# Patient Record
Sex: Female | Born: 1985 | Race: White | Hispanic: No | Marital: Single
Health system: Southern US, Community
[De-identification: ages and names within clinical notes are randomized; demographics above are authoritative.]

---

## 2014-09-28 ENCOUNTER — Ambulatory Visit: Payer: Self-pay | Admitting: Obstetrics and Gynecology

## 2016-03-13 DIAGNOSIS — N979 Female infertility, unspecified: Secondary | ICD-10-CM | POA: Diagnosis not present

## 2016-03-18 DIAGNOSIS — N979 Female infertility, unspecified: Secondary | ICD-10-CM | POA: Diagnosis not present

## 2016-03-20 DIAGNOSIS — N979 Female infertility, unspecified: Secondary | ICD-10-CM | POA: Diagnosis not present

## 2016-03-22 DIAGNOSIS — Z3189 Encounter for other procreative management: Secondary | ICD-10-CM | POA: Diagnosis not present

## 2016-04-06 DIAGNOSIS — Z3200 Encounter for pregnancy test, result unknown: Secondary | ICD-10-CM | POA: Diagnosis not present

## 2016-04-09 DIAGNOSIS — Z3201 Encounter for pregnancy test, result positive: Secondary | ICD-10-CM | POA: Diagnosis not present

## 2016-04-26 DIAGNOSIS — O200 Threatened abortion: Secondary | ICD-10-CM | POA: Diagnosis not present

## 2016-05-07 DIAGNOSIS — O200 Threatened abortion: Secondary | ICD-10-CM | POA: Diagnosis not present

## 2016-05-16 DIAGNOSIS — Z36 Encounter for antenatal screening of mother: Secondary | ICD-10-CM | POA: Diagnosis not present

## 2016-05-16 DIAGNOSIS — Z3480 Encounter for supervision of other normal pregnancy, unspecified trimester: Secondary | ICD-10-CM | POA: Diagnosis not present

## 2016-05-22 DIAGNOSIS — Z36 Encounter for antenatal screening of mother: Secondary | ICD-10-CM | POA: Diagnosis not present

## 2016-06-08 DIAGNOSIS — Z315 Encounter for genetic counseling: Secondary | ICD-10-CM | POA: Diagnosis not present

## 2016-06-08 DIAGNOSIS — Z36 Encounter for antenatal screening of mother: Secondary | ICD-10-CM | POA: Diagnosis not present

## 2016-06-20 DIAGNOSIS — Z23 Encounter for immunization: Secondary | ICD-10-CM | POA: Diagnosis not present

## 2016-07-02 DIAGNOSIS — F988 Other specified behavioral and emotional disorders with onset usually occurring in childhood and adolescence: Secondary | ICD-10-CM | POA: Diagnosis not present

## 2016-07-02 DIAGNOSIS — Z6831 Body mass index (BMI) 31.0-31.9, adult: Secondary | ICD-10-CM | POA: Diagnosis not present

## 2016-07-02 DIAGNOSIS — Z0000 Encounter for general adult medical examination without abnormal findings: Secondary | ICD-10-CM | POA: Diagnosis not present

## 2016-07-13 DIAGNOSIS — Z3A26 26 weeks gestation of pregnancy: Secondary | ICD-10-CM | POA: Diagnosis not present

## 2016-07-13 DIAGNOSIS — Z3A28 28 weeks gestation of pregnancy: Secondary | ICD-10-CM | POA: Diagnosis not present

## 2016-07-13 DIAGNOSIS — O30042 Twin pregnancy, dichorionic/diamniotic, second trimester: Secondary | ICD-10-CM | POA: Diagnosis not present

## 2016-07-13 DIAGNOSIS — Z3402 Encounter for supervision of normal first pregnancy, second trimester: Secondary | ICD-10-CM | POA: Diagnosis not present

## 2016-07-13 DIAGNOSIS — Z3482 Encounter for supervision of other normal pregnancy, second trimester: Secondary | ICD-10-CM | POA: Diagnosis not present

## 2016-09-10 DIAGNOSIS — Z3480 Encounter for supervision of other normal pregnancy, unspecified trimester: Secondary | ICD-10-CM | POA: Diagnosis not present

## 2016-09-10 DIAGNOSIS — O30042 Twin pregnancy, dichorionic/diamniotic, second trimester: Secondary | ICD-10-CM | POA: Diagnosis not present

## 2016-09-10 DIAGNOSIS — Z3A26 26 weeks gestation of pregnancy: Secondary | ICD-10-CM | POA: Diagnosis not present

## 2016-09-10 DIAGNOSIS — Z3482 Encounter for supervision of other normal pregnancy, second trimester: Secondary | ICD-10-CM | POA: Diagnosis not present

## 2016-09-10 DIAGNOSIS — Z3483 Encounter for supervision of other normal pregnancy, third trimester: Secondary | ICD-10-CM | POA: Diagnosis not present

## 2016-09-14 DIAGNOSIS — O99282 Endocrine, nutritional and metabolic diseases complicating pregnancy, second trimester: Secondary | ICD-10-CM | POA: Diagnosis not present

## 2016-09-21 DIAGNOSIS — Z6835 Body mass index (BMI) 35.0-35.9, adult: Secondary | ICD-10-CM | POA: Diagnosis not present

## 2016-09-21 DIAGNOSIS — O24419 Gestational diabetes mellitus in pregnancy, unspecified control: Secondary | ICD-10-CM | POA: Diagnosis not present

## 2016-10-10 DIAGNOSIS — O24410 Gestational diabetes mellitus in pregnancy, diet controlled: Secondary | ICD-10-CM | POA: Diagnosis not present

## 2016-10-10 DIAGNOSIS — Z3A30 30 weeks gestation of pregnancy: Secondary | ICD-10-CM | POA: Diagnosis not present

## 2016-10-10 DIAGNOSIS — Z23 Encounter for immunization: Secondary | ICD-10-CM | POA: Diagnosis not present

## 2016-10-10 DIAGNOSIS — O30043 Twin pregnancy, dichorionic/diamniotic, third trimester: Secondary | ICD-10-CM | POA: Diagnosis not present

## 2016-10-17 DIAGNOSIS — Z3483 Encounter for supervision of other normal pregnancy, third trimester: Secondary | ICD-10-CM | POA: Diagnosis not present

## 2016-10-17 DIAGNOSIS — Z3482 Encounter for supervision of other normal pregnancy, second trimester: Secondary | ICD-10-CM | POA: Diagnosis not present

## 2016-10-29 DIAGNOSIS — O30043 Twin pregnancy, dichorionic/diamniotic, third trimester: Secondary | ICD-10-CM | POA: Diagnosis not present

## 2016-11-06 DIAGNOSIS — Z3A34 34 weeks gestation of pregnancy: Secondary | ICD-10-CM | POA: Diagnosis not present

## 2016-11-06 DIAGNOSIS — Z3403 Encounter for supervision of normal first pregnancy, third trimester: Secondary | ICD-10-CM | POA: Diagnosis not present

## 2016-11-06 DIAGNOSIS — O30042 Twin pregnancy, dichorionic/diamniotic, second trimester: Secondary | ICD-10-CM | POA: Diagnosis not present

## 2016-11-06 DIAGNOSIS — O30043 Twin pregnancy, dichorionic/diamniotic, third trimester: Secondary | ICD-10-CM | POA: Diagnosis not present

## 2016-11-13 DIAGNOSIS — Z3483 Encounter for supervision of other normal pregnancy, third trimester: Secondary | ICD-10-CM | POA: Diagnosis not present

## 2016-11-13 DIAGNOSIS — Z3480 Encounter for supervision of other normal pregnancy, unspecified trimester: Secondary | ICD-10-CM | POA: Diagnosis not present

## 2016-11-15 DIAGNOSIS — O30003 Twin pregnancy, unspecified number of placenta and unspecified number of amniotic sacs, third trimester: Secondary | ICD-10-CM | POA: Diagnosis not present

## 2016-11-15 DIAGNOSIS — M79662 Pain in left lower leg: Secondary | ICD-10-CM | POA: Diagnosis not present

## 2016-11-15 DIAGNOSIS — O9989 Other specified diseases and conditions complicating pregnancy, childbirth and the puerperium: Secondary | ICD-10-CM | POA: Diagnosis not present

## 2016-11-15 DIAGNOSIS — M79669 Pain in unspecified lower leg: Secondary | ICD-10-CM | POA: Diagnosis not present

## 2016-11-15 DIAGNOSIS — Z3A36 36 weeks gestation of pregnancy: Secondary | ICD-10-CM | POA: Diagnosis not present

## 2016-11-15 DIAGNOSIS — R590 Localized enlarged lymph nodes: Secondary | ICD-10-CM | POA: Diagnosis not present

## 2016-11-15 DIAGNOSIS — M79661 Pain in right lower leg: Secondary | ICD-10-CM | POA: Diagnosis not present

## 2016-11-20 DIAGNOSIS — O30043 Twin pregnancy, dichorionic/diamniotic, third trimester: Secondary | ICD-10-CM | POA: Diagnosis not present

## 2016-11-20 DIAGNOSIS — O321XX2 Maternal care for breech presentation, fetus 2: Secondary | ICD-10-CM | POA: Diagnosis not present

## 2016-11-20 DIAGNOSIS — O2686 Pruritic urticarial papules and plaques of pregnancy (PUPPP): Secondary | ICD-10-CM | POA: Diagnosis not present

## 2016-11-20 DIAGNOSIS — Z3A36 36 weeks gestation of pregnancy: Secondary | ICD-10-CM | POA: Diagnosis not present

## 2016-11-20 DIAGNOSIS — R590 Localized enlarged lymph nodes: Secondary | ICD-10-CM | POA: Diagnosis not present

## 2016-11-26 DIAGNOSIS — Z3493 Encounter for supervision of normal pregnancy, unspecified, third trimester: Secondary | ICD-10-CM | POA: Diagnosis not present

## 2016-11-26 DIAGNOSIS — O30043 Twin pregnancy, dichorionic/diamniotic, third trimester: Secondary | ICD-10-CM | POA: Diagnosis not present

## 2016-11-29 DIAGNOSIS — H1032 Unspecified acute conjunctivitis, left eye: Secondary | ICD-10-CM | POA: Diagnosis not present

## 2016-11-30 DIAGNOSIS — Z3A38 38 weeks gestation of pregnancy: Secondary | ICD-10-CM | POA: Diagnosis not present

## 2016-12-03 DIAGNOSIS — O9081 Anemia of the puerperium: Secondary | ICD-10-CM | POA: Diagnosis not present

## 2016-12-03 DIAGNOSIS — O321XX9 Maternal care for breech presentation, other fetus: Secondary | ICD-10-CM | POA: Diagnosis not present

## 2016-12-03 DIAGNOSIS — O321XX2 Maternal care for breech presentation, fetus 2: Secondary | ICD-10-CM | POA: Diagnosis not present

## 2016-12-03 DIAGNOSIS — G8918 Other acute postprocedural pain: Secondary | ICD-10-CM | POA: Diagnosis not present

## 2016-12-03 DIAGNOSIS — K219 Gastro-esophageal reflux disease without esophagitis: Secondary | ICD-10-CM | POA: Diagnosis not present

## 2016-12-03 DIAGNOSIS — R1084 Generalized abdominal pain: Secondary | ICD-10-CM | POA: Diagnosis not present

## 2016-12-03 DIAGNOSIS — Z3A38 38 weeks gestation of pregnancy: Secondary | ICD-10-CM | POA: Diagnosis not present

## 2016-12-03 DIAGNOSIS — D649 Anemia, unspecified: Secondary | ICD-10-CM | POA: Diagnosis not present

## 2016-12-03 DIAGNOSIS — O2412 Pre-existing diabetes mellitus, type 2, in childbirth: Secondary | ICD-10-CM | POA: Diagnosis not present

## 2016-12-03 DIAGNOSIS — O30003 Twin pregnancy, unspecified number of placenta and unspecified number of amniotic sacs, third trimester: Secondary | ICD-10-CM | POA: Diagnosis not present

## 2016-12-03 DIAGNOSIS — O9962 Diseases of the digestive system complicating childbirth: Secondary | ICD-10-CM | POA: Diagnosis not present

## 2016-12-03 DIAGNOSIS — R600 Localized edema: Secondary | ICD-10-CM | POA: Diagnosis not present

## 2016-12-04 DIAGNOSIS — G8918 Other acute postprocedural pain: Secondary | ICD-10-CM | POA: Diagnosis not present

## 2016-12-04 DIAGNOSIS — R600 Localized edema: Secondary | ICD-10-CM | POA: Diagnosis not present

## 2016-12-04 DIAGNOSIS — O30003 Twin pregnancy, unspecified number of placenta and unspecified number of amniotic sacs, third trimester: Secondary | ICD-10-CM | POA: Diagnosis not present

## 2016-12-04 DIAGNOSIS — R1084 Generalized abdominal pain: Secondary | ICD-10-CM | POA: Diagnosis not present

## 2017-01-28 DIAGNOSIS — R748 Abnormal levels of other serum enzymes: Secondary | ICD-10-CM | POA: Diagnosis not present

## 2017-01-28 DIAGNOSIS — F988 Other specified behavioral and emotional disorders with onset usually occurring in childhood and adolescence: Secondary | ICD-10-CM | POA: Diagnosis not present

## 2017-01-28 DIAGNOSIS — O24419 Gestational diabetes mellitus in pregnancy, unspecified control: Secondary | ICD-10-CM | POA: Diagnosis not present

## 2017-01-28 DIAGNOSIS — E663 Overweight: Secondary | ICD-10-CM | POA: Diagnosis not present

## 2017-01-28 DIAGNOSIS — D649 Anemia, unspecified: Secondary | ICD-10-CM | POA: Diagnosis not present

## 2017-01-28 DIAGNOSIS — Z79899 Other long term (current) drug therapy: Secondary | ICD-10-CM | POA: Diagnosis not present

## 2017-08-13 DIAGNOSIS — H68001 Unspecified Eustachian salpingitis, right ear: Secondary | ICD-10-CM | POA: Diagnosis not present

## 2017-08-28 DIAGNOSIS — Z0000 Encounter for general adult medical examination without abnormal findings: Secondary | ICD-10-CM | POA: Diagnosis not present

## 2017-08-30 DIAGNOSIS — Z23 Encounter for immunization: Secondary | ICD-10-CM | POA: Diagnosis not present

## 2017-08-30 DIAGNOSIS — Z1331 Encounter for screening for depression: Secondary | ICD-10-CM | POA: Diagnosis not present

## 2017-08-30 DIAGNOSIS — F988 Other specified behavioral and emotional disorders with onset usually occurring in childhood and adolescence: Secondary | ICD-10-CM | POA: Diagnosis not present

## 2017-08-30 DIAGNOSIS — Z0000 Encounter for general adult medical examination without abnormal findings: Secondary | ICD-10-CM | POA: Diagnosis not present

## 2017-08-30 DIAGNOSIS — Z79899 Other long term (current) drug therapy: Secondary | ICD-10-CM | POA: Diagnosis not present

## 2017-11-06 DIAGNOSIS — Z6829 Body mass index (BMI) 29.0-29.9, adult: Secondary | ICD-10-CM | POA: Diagnosis not present

## 2017-11-06 DIAGNOSIS — N390 Urinary tract infection, site not specified: Secondary | ICD-10-CM | POA: Diagnosis not present

## 2017-11-27 DIAGNOSIS — J111 Influenza due to unidentified influenza virus with other respiratory manifestations: Secondary | ICD-10-CM | POA: Diagnosis not present

## 2017-11-29 DIAGNOSIS — Z6829 Body mass index (BMI) 29.0-29.9, adult: Secondary | ICD-10-CM | POA: Diagnosis not present

## 2017-11-29 DIAGNOSIS — J069 Acute upper respiratory infection, unspecified: Secondary | ICD-10-CM | POA: Diagnosis not present

## 2017-11-29 DIAGNOSIS — B9789 Other viral agents as the cause of diseases classified elsewhere: Secondary | ICD-10-CM | POA: Diagnosis not present

## 2017-12-04 DIAGNOSIS — J209 Acute bronchitis, unspecified: Secondary | ICD-10-CM | POA: Diagnosis not present

## 2017-12-04 DIAGNOSIS — Z6829 Body mass index (BMI) 29.0-29.9, adult: Secondary | ICD-10-CM | POA: Diagnosis not present

## 2018-04-10 DIAGNOSIS — Z8632 Personal history of gestational diabetes: Secondary | ICD-10-CM | POA: Diagnosis not present

## 2018-04-10 DIAGNOSIS — Z6830 Body mass index (BMI) 30.0-30.9, adult: Secondary | ICD-10-CM | POA: Diagnosis not present

## 2018-04-10 DIAGNOSIS — Z01419 Encounter for gynecological examination (general) (routine) without abnormal findings: Secondary | ICD-10-CM | POA: Diagnosis not present

## 2018-04-10 DIAGNOSIS — N643 Galactorrhea not associated with childbirth: Secondary | ICD-10-CM | POA: Diagnosis not present

## 2018-04-21 DIAGNOSIS — Z79899 Other long term (current) drug therapy: Secondary | ICD-10-CM | POA: Diagnosis not present

## 2018-04-21 DIAGNOSIS — Z8632 Personal history of gestational diabetes: Secondary | ICD-10-CM | POA: Diagnosis not present

## 2018-04-21 DIAGNOSIS — Z131 Encounter for screening for diabetes mellitus: Secondary | ICD-10-CM | POA: Diagnosis not present

## 2018-04-21 DIAGNOSIS — F988 Other specified behavioral and emotional disorders with onset usually occurring in childhood and adolescence: Secondary | ICD-10-CM | POA: Diagnosis not present

## 2018-11-28 DIAGNOSIS — Z79899 Other long term (current) drug therapy: Secondary | ICD-10-CM | POA: Diagnosis not present

## 2018-11-28 DIAGNOSIS — F988 Other specified behavioral and emotional disorders with onset usually occurring in childhood and adolescence: Secondary | ICD-10-CM | POA: Diagnosis not present

## 2018-11-28 DIAGNOSIS — Z6829 Body mass index (BMI) 29.0-29.9, adult: Secondary | ICD-10-CM | POA: Diagnosis not present

## 2018-12-25 DIAGNOSIS — Z87440 Personal history of urinary (tract) infections: Secondary | ICD-10-CM | POA: Diagnosis not present

## 2019-02-16 DIAGNOSIS — E289 Ovarian dysfunction, unspecified: Secondary | ICD-10-CM | POA: Diagnosis not present

## 2019-05-25 DIAGNOSIS — Z79899 Other long term (current) drug therapy: Secondary | ICD-10-CM | POA: Diagnosis not present

## 2019-05-25 DIAGNOSIS — Z1331 Encounter for screening for depression: Secondary | ICD-10-CM | POA: Diagnosis not present

## 2019-05-25 DIAGNOSIS — Z6828 Body mass index (BMI) 28.0-28.9, adult: Secondary | ICD-10-CM | POA: Diagnosis not present

## 2019-05-25 DIAGNOSIS — F988 Other specified behavioral and emotional disorders with onset usually occurring in childhood and adolescence: Secondary | ICD-10-CM | POA: Diagnosis not present

## 2019-06-09 DIAGNOSIS — L821 Other seborrheic keratosis: Secondary | ICD-10-CM | POA: Diagnosis not present

## 2019-06-09 DIAGNOSIS — D1722 Benign lipomatous neoplasm of skin and subcutaneous tissue of left arm: Secondary | ICD-10-CM | POA: Diagnosis not present

## 2019-06-09 DIAGNOSIS — D225 Melanocytic nevi of trunk: Secondary | ICD-10-CM | POA: Diagnosis not present

## 2019-06-09 DIAGNOSIS — I789 Disease of capillaries, unspecified: Secondary | ICD-10-CM | POA: Diagnosis not present

## 2019-06-11 DIAGNOSIS — Z6831 Body mass index (BMI) 31.0-31.9, adult: Secondary | ICD-10-CM | POA: Diagnosis not present

## 2019-06-11 DIAGNOSIS — Z3009 Encounter for other general counseling and advice on contraception: Secondary | ICD-10-CM | POA: Diagnosis not present

## 2019-06-11 DIAGNOSIS — Z01419 Encounter for gynecological examination (general) (routine) without abnormal findings: Secondary | ICD-10-CM | POA: Diagnosis not present

## 2019-11-24 DIAGNOSIS — Z79899 Other long term (current) drug therapy: Secondary | ICD-10-CM | POA: Diagnosis not present

## 2019-11-24 DIAGNOSIS — Z6828 Body mass index (BMI) 28.0-28.9, adult: Secondary | ICD-10-CM | POA: Diagnosis not present

## 2019-11-24 DIAGNOSIS — F988 Other specified behavioral and emotional disorders with onset usually occurring in childhood and adolescence: Secondary | ICD-10-CM | POA: Diagnosis not present

## 2020-05-24 DIAGNOSIS — N390 Urinary tract infection, site not specified: Secondary | ICD-10-CM | POA: Diagnosis not present

## 2020-05-24 DIAGNOSIS — Z79899 Other long term (current) drug therapy: Secondary | ICD-10-CM | POA: Diagnosis not present

## 2020-05-24 DIAGNOSIS — Z6829 Body mass index (BMI) 29.0-29.9, adult: Secondary | ICD-10-CM | POA: Diagnosis not present

## 2020-05-24 DIAGNOSIS — F988 Other specified behavioral and emotional disorders with onset usually occurring in childhood and adolescence: Secondary | ICD-10-CM | POA: Diagnosis not present

## 2020-06-21 DIAGNOSIS — N943 Premenstrual tension syndrome: Secondary | ICD-10-CM | POA: Diagnosis not present

## 2020-06-21 DIAGNOSIS — Z01419 Encounter for gynecological examination (general) (routine) without abnormal findings: Secondary | ICD-10-CM | POA: Diagnosis not present

## 2020-06-21 DIAGNOSIS — N6452 Nipple discharge: Secondary | ICD-10-CM | POA: Diagnosis not present

## 2020-06-21 DIAGNOSIS — Z6831 Body mass index (BMI) 31.0-31.9, adult: Secondary | ICD-10-CM | POA: Diagnosis not present

## 2020-07-29 DIAGNOSIS — Z20822 Contact with and (suspected) exposure to covid-19: Secondary | ICD-10-CM | POA: Diagnosis not present

## 2020-10-04 DIAGNOSIS — Z20822 Contact with and (suspected) exposure to covid-19: Secondary | ICD-10-CM | POA: Diagnosis not present

## 2020-10-13 DIAGNOSIS — H1045 Other chronic allergic conjunctivitis: Secondary | ICD-10-CM | POA: Diagnosis not present

## 2020-11-25 DIAGNOSIS — J4520 Mild intermittent asthma, uncomplicated: Secondary | ICD-10-CM | POA: Diagnosis not present

## 2020-11-25 DIAGNOSIS — Z79899 Other long term (current) drug therapy: Secondary | ICD-10-CM | POA: Diagnosis not present

## 2020-11-25 DIAGNOSIS — F988 Other specified behavioral and emotional disorders with onset usually occurring in childhood and adolescence: Secondary | ICD-10-CM | POA: Diagnosis not present

## 2020-11-25 DIAGNOSIS — Z6830 Body mass index (BMI) 30.0-30.9, adult: Secondary | ICD-10-CM | POA: Diagnosis not present

## 2020-11-25 DIAGNOSIS — Z130 Encounter for screening for diseases of the blood and blood-forming organs and certain disorders involving the immune mechanism: Secondary | ICD-10-CM | POA: Diagnosis not present

## 2020-11-25 DIAGNOSIS — Z1331 Encounter for screening for depression: Secondary | ICD-10-CM | POA: Diagnosis not present

## 2020-11-25 DIAGNOSIS — Z131 Encounter for screening for diabetes mellitus: Secondary | ICD-10-CM | POA: Diagnosis not present

## 2020-11-25 DIAGNOSIS — Z13220 Encounter for screening for lipoid disorders: Secondary | ICD-10-CM | POA: Diagnosis not present

## 2021-05-29 DIAGNOSIS — F988 Other specified behavioral and emotional disorders with onset usually occurring in childhood and adolescence: Secondary | ICD-10-CM | POA: Diagnosis not present

## 2021-05-29 DIAGNOSIS — E7800 Pure hypercholesterolemia, unspecified: Secondary | ICD-10-CM | POA: Diagnosis not present

## 2021-05-29 DIAGNOSIS — Z79899 Other long term (current) drug therapy: Secondary | ICD-10-CM | POA: Diagnosis not present

## 2021-05-29 DIAGNOSIS — J4520 Mild intermittent asthma, uncomplicated: Secondary | ICD-10-CM | POA: Diagnosis not present

## 2021-07-06 DIAGNOSIS — N760 Acute vaginitis: Secondary | ICD-10-CM | POA: Diagnosis not present

## 2021-07-06 DIAGNOSIS — Z6831 Body mass index (BMI) 31.0-31.9, adult: Secondary | ICD-10-CM | POA: Diagnosis not present

## 2021-07-06 DIAGNOSIS — Z01419 Encounter for gynecological examination (general) (routine) without abnormal findings: Secondary | ICD-10-CM | POA: Diagnosis not present

## 2021-07-06 DIAGNOSIS — Z113 Encounter for screening for infections with a predominantly sexual mode of transmission: Secondary | ICD-10-CM | POA: Diagnosis not present

## 2021-07-22 DIAGNOSIS — J069 Acute upper respiratory infection, unspecified: Secondary | ICD-10-CM | POA: Diagnosis not present

## 2021-10-12 ENCOUNTER — Other Ambulatory Visit: Payer: Self-pay | Admitting: Family Medicine

## 2021-10-12 DIAGNOSIS — Z6830 Body mass index (BMI) 30.0-30.9, adult: Secondary | ICD-10-CM | POA: Diagnosis not present

## 2021-10-12 DIAGNOSIS — R1011 Right upper quadrant pain: Secondary | ICD-10-CM | POA: Diagnosis not present

## 2021-10-12 DIAGNOSIS — E7800 Pure hypercholesterolemia, unspecified: Secondary | ICD-10-CM | POA: Diagnosis not present

## 2021-10-12 DIAGNOSIS — Z8632 Personal history of gestational diabetes: Secondary | ICD-10-CM | POA: Diagnosis not present

## 2021-10-23 ENCOUNTER — Ambulatory Visit
Admission: RE | Admit: 2021-10-23 | Discharge: 2021-10-23 | Disposition: A | Discharge status: Home / Self Care | Payer: BC Managed Care – PPO | Source: Ambulatory Visit | Attending: Family Medicine | Admitting: Family Medicine

## 2021-10-23 DIAGNOSIS — R1011 Right upper quadrant pain: Secondary | ICD-10-CM | POA: Insufficient documentation

## 2021-10-27 ENCOUNTER — Other Ambulatory Visit: Payer: Self-pay | Admitting: Family Medicine

## 2021-10-27 DIAGNOSIS — R1011 Right upper quadrant pain: Secondary | ICD-10-CM

## 2021-10-29 DIAGNOSIS — J069 Acute upper respiratory infection, unspecified: Secondary | ICD-10-CM | POA: Diagnosis not present

## 2021-11-30 DIAGNOSIS — E7800 Pure hypercholesterolemia, unspecified: Secondary | ICD-10-CM | POA: Diagnosis not present

## 2021-11-30 DIAGNOSIS — F988 Other specified behavioral and emotional disorders with onset usually occurring in childhood and adolescence: Secondary | ICD-10-CM | POA: Diagnosis not present

## 2021-11-30 DIAGNOSIS — Z79899 Other long term (current) drug therapy: Secondary | ICD-10-CM | POA: Diagnosis not present

## 2021-11-30 DIAGNOSIS — Z6831 Body mass index (BMI) 31.0-31.9, adult: Secondary | ICD-10-CM | POA: Diagnosis not present

## 2021-11-30 DIAGNOSIS — J4520 Mild intermittent asthma, uncomplicated: Secondary | ICD-10-CM | POA: Diagnosis not present

## 2022-01-10 DIAGNOSIS — J029 Acute pharyngitis, unspecified: Secondary | ICD-10-CM | POA: Diagnosis not present

## 2022-04-22 DIAGNOSIS — J069 Acute upper respiratory infection, unspecified: Secondary | ICD-10-CM | POA: Diagnosis not present

## 2022-04-22 DIAGNOSIS — R0981 Nasal congestion: Secondary | ICD-10-CM | POA: Diagnosis not present

## 2022-04-22 DIAGNOSIS — H9203 Otalgia, bilateral: Secondary | ICD-10-CM | POA: Diagnosis not present

## 2022-06-05 DIAGNOSIS — Z8632 Personal history of gestational diabetes: Secondary | ICD-10-CM | POA: Diagnosis not present

## 2022-06-05 DIAGNOSIS — Z79899 Other long term (current) drug therapy: Secondary | ICD-10-CM | POA: Diagnosis not present

## 2022-06-05 DIAGNOSIS — F988 Other specified behavioral and emotional disorders with onset usually occurring in childhood and adolescence: Secondary | ICD-10-CM | POA: Diagnosis not present

## 2022-06-05 DIAGNOSIS — E7800 Pure hypercholesterolemia, unspecified: Secondary | ICD-10-CM | POA: Diagnosis not present

## 2022-06-05 DIAGNOSIS — Z130 Encounter for screening for diseases of the blood and blood-forming organs and certain disorders involving the immune mechanism: Secondary | ICD-10-CM | POA: Diagnosis not present

## 2022-06-05 DIAGNOSIS — J4520 Mild intermittent asthma, uncomplicated: Secondary | ICD-10-CM | POA: Diagnosis not present

## 2022-07-12 DIAGNOSIS — Z01419 Encounter for gynecological examination (general) (routine) without abnormal findings: Secondary | ICD-10-CM | POA: Diagnosis not present

## 2022-07-12 DIAGNOSIS — Z01411 Encounter for gynecological examination (general) (routine) with abnormal findings: Secondary | ICD-10-CM | POA: Diagnosis not present

## 2022-07-12 DIAGNOSIS — Z1151 Encounter for screening for human papillomavirus (HPV): Secondary | ICD-10-CM | POA: Diagnosis not present

## 2022-07-12 DIAGNOSIS — Z6829 Body mass index (BMI) 29.0-29.9, adult: Secondary | ICD-10-CM | POA: Diagnosis not present

## 2022-07-12 DIAGNOSIS — N6310 Unspecified lump in the right breast, unspecified quadrant: Secondary | ICD-10-CM | POA: Diagnosis not present

## 2022-07-19 DIAGNOSIS — N6310 Unspecified lump in the right breast, unspecified quadrant: Secondary | ICD-10-CM | POA: Diagnosis not present

## 2022-11-07 DIAGNOSIS — R1031 Right lower quadrant pain: Secondary | ICD-10-CM | POA: Diagnosis not present

## 2022-11-07 DIAGNOSIS — R102 Pelvic and perineal pain: Secondary | ICD-10-CM | POA: Diagnosis not present

## 2022-12-06 DIAGNOSIS — Z1331 Encounter for screening for depression: Secondary | ICD-10-CM | POA: Diagnosis not present

## 2022-12-06 DIAGNOSIS — J4520 Mild intermittent asthma, uncomplicated: Secondary | ICD-10-CM | POA: Diagnosis not present

## 2022-12-06 DIAGNOSIS — N390 Urinary tract infection, site not specified: Secondary | ICD-10-CM | POA: Diagnosis not present

## 2022-12-06 DIAGNOSIS — F988 Other specified behavioral and emotional disorders with onset usually occurring in childhood and adolescence: Secondary | ICD-10-CM | POA: Diagnosis not present

## 2022-12-06 DIAGNOSIS — Z79899 Other long term (current) drug therapy: Secondary | ICD-10-CM | POA: Diagnosis not present

## 2023-04-16 ENCOUNTER — Other Ambulatory Visit: Payer: Self-pay

## 2023-04-16 ENCOUNTER — Emergency Department: Payer: BC Managed Care – PPO

## 2023-04-16 ENCOUNTER — Emergency Department
Admission: EM | Admit: 2023-04-16 | Discharge: 2023-04-16 | Disposition: A | Discharge status: Home / Self Care | Payer: BC Managed Care – PPO | Attending: Emergency Medicine | Admitting: Emergency Medicine

## 2023-04-16 DIAGNOSIS — R1011 Right upper quadrant pain: Secondary | ICD-10-CM | POA: Insufficient documentation

## 2023-04-16 DIAGNOSIS — M546 Pain in thoracic spine: Secondary | ICD-10-CM | POA: Diagnosis not present

## 2023-04-16 DIAGNOSIS — R0781 Pleurodynia: Secondary | ICD-10-CM | POA: Diagnosis not present

## 2023-04-16 LAB — CBC
HCT: 37 % (ref 36.0–46.0)
Hemoglobin: 13.2 g/dL (ref 12.0–15.0)
MCH: 31.5 pg (ref 26.0–34.0)
MCHC: 35.7 g/dL (ref 30.0–36.0)
MCV: 88.3 fL (ref 80.0–100.0)
Platelets: 260 10*3/uL (ref 150–400)
RBC: 4.19 MIL/uL (ref 3.87–5.11)
RDW: 11.5 % (ref 11.5–15.5)
WBC: 5.8 10*3/uL (ref 4.0–10.5)
nRBC: 0 % (ref 0.0–0.2)

## 2023-04-16 LAB — URINALYSIS, ROUTINE W REFLEX MICROSCOPIC
Bilirubin Urine: NEGATIVE
Glucose, UA: NEGATIVE mg/dL
Hgb urine dipstick: NEGATIVE
Ketones, ur: 5 mg/dL — AB
Leukocytes,Ua: NEGATIVE
Nitrite: NEGATIVE
Protein, ur: NEGATIVE mg/dL
Specific Gravity, Urine: 1.023 (ref 1.005–1.030)
pH: 7 (ref 5.0–8.0)

## 2023-04-16 LAB — COMPREHENSIVE METABOLIC PANEL
ALT: 22 U/L (ref 0–44)
AST: 17 U/L (ref 15–41)
Albumin: 4.4 g/dL (ref 3.5–5.0)
Alkaline Phosphatase: 51 U/L (ref 38–126)
Anion gap: 8 (ref 5–15)
BUN: 16 mg/dL (ref 6–20)
CO2: 21 mmol/L — ABNORMAL LOW (ref 22–32)
Calcium: 9.2 mg/dL (ref 8.9–10.3)
Chloride: 108 mmol/L (ref 98–111)
Creatinine, Ser: 0.84 mg/dL (ref 0.44–1.00)
GFR, Estimated: >60 mL/min (ref >60)
Glucose, Bld: 91 mg/dL (ref 70–99)
Potassium: 3.8 mmol/L (ref 3.5–5.1)
Sodium: 137 mmol/L (ref 135–145)
Total Bilirubin: 0.8 mg/dL (ref 0.3–1.2)
Total Protein: 7.4 g/dL (ref 6.5–8.1)

## 2023-04-16 LAB — D-DIMER, QUANTITATIVE: D-Dimer, Quant: <0.27 ug/mL-FEU (ref 0.00–0.50)

## 2023-04-16 LAB — LIPASE, BLOOD: Lipase: 36 U/L (ref 11–51)

## 2023-04-16 LAB — POC URINE PREG, ED: Preg Test, Ur: NEGATIVE

## 2023-04-16 MED ORDER — METHOCARBAMOL 500 MG PO TABS: 500.0000 mg | ORAL_TABLET | Freq: Three times a day (TID) | ORAL | 1 refills | Status: AC | PRN

## 2023-04-16 MED ORDER — KETOROLAC TROMETHAMINE 30 MG/ML IJ SOLN
30.0000 mg | Freq: Once | INTRAMUSCULAR | Status: AC
  Administered 2023-04-16: 30 mg via INTRAVENOUS
  Filled 2023-04-16: qty 1

## 2023-04-16 MED ORDER — NAPROXEN 500 MG PO TABS: 500.0000 mg | ORAL_TABLET | Freq: Two times a day (BID) | ORAL | 2 refills | Status: AC

## 2023-04-16 NOTE — ED Provider Notes (Signed)
[facility] Provider Note    Event Date/Time   First MD Initiated Contact with Patient 04/16/23 [phone]     (approximate)   History   Side pain  HPI  Joanne Watson is a 37 y.o. female with no significant past medical history presents with complaints of right side pain.  She reports this developed relatively abruptly overnight.  Denies a history of kidney stones.  No dysuria, no hematuria.  She reports the pain is somewhat worse when she takes a deep breath.  No fevers or cough     Physical Exam   Triage Vital Signs: ED Triage Vitals  Encounter Vitals Group     BP 04/16/23 0907 103/65     Systolic BP Percentile --      Diastolic BP Percentile --      Pulse Rate 04/16/23 0907 60     Resp 04/16/23 0907 19     Temp 04/16/23 0907 98.5 F (36.9 C)     Temp Source 04/16/23 0907 Oral     SpO2 04/16/23 0907 100 %     Weight --      Height --      Head Circumference --      Peak Flow --      Pain Score 04/16/23 0905 7     Pain Loc --      Pain Education --      Exclude from Growth Chart --     Most recent vital signs: Vitals:   04/16/23 0907  BP: 103/65  Pulse: 60  Resp: 19  Temp: 98.5 F (36.9 C)  SpO2: 100%     General: Awake, no distress.  CV:  Good peripheral perfusion.  Resp:  Normal effort.  Clear to auscultation bilaterally Abd:  No distention.  No CVA tenderness Other:     ED Results / Procedures / Treatments   Labs (all labs ordered are listed, but only abnormal results are displayed) Labs Reviewed  COMPREHENSIVE METABOLIC PANEL - Abnormal; Notable for the following components:      Result Value   CO2 21 (*)    All other components within normal limits  URINALYSIS, ROUTINE W REFLEX MICROSCOPIC - Abnormal; Notable for the following components:   Color, Urine YELLOW (*)    APPearance HAZY (*)    Ketones, ur 5 (*)    All other components within normal limits  LIPASE, BLOOD  CBC  D-DIMER, QUANTITATIVE  POC URINE PREG,  ED     EKG     RADIOLOGY Chest x-ray viewed interpret by me, no pneumonia or pneumothorax    PROCEDURES:  Critical Care performed:   Procedures   MEDICATIONS ORDERED IN ED: Medications  ketorolac (TORADOL) 30 MG/ML injection 30 mg (30 mg Intravenous Given 04/16/23 1058)     IMPRESSION / MDM / ASSESSMENT AND PLAN / ED COURSE  I reviewed the triage vital signs and the nursing notes. Patient's presentation is most consistent with acute illness / injury with system symptoms.  Patient presents with right-sided pain as described above, pain appears to be in the lower lateral chest more than in the kidney area but difficult to ascertain, no rash to suggest shingles.  Differential includes pleurisy, low likelihood for PE given no tachycardia or shortness of breath.  Will send D-dimer.  Possibility of pneumonia, kidney stone, UTI  D-dimer is negative, lab work overall quite reassuring, will send for CT renal stone study, treated with IV Toradol  CT renal stone  study is negative, patient is feeling better, suspect musculoskeletal cause of pain, will treat with NSAIDs, muscle relaxer, return precautions discussed, patient agrees this plan      FINAL CLINICAL IMPRESSION(S) / ED DIAGNOSES   Final diagnoses:  Acute right-sided thoracic back pain     Rx / DC Orders   ED Discharge Orders          Ordered    naproxen (NAPROSYN) 500 MG tablet  2 times daily with meals        04/16/23 1153    methocarbamol (ROBAXIN) 500 MG tablet  Every 8 hours PRN        04/16/23 1153             Note:  This document was prepared using Dragon voice recognition software and may include unintentional dictation errors.   Jene Every, MD 04/16/23 [phone]

## 2023-04-16 NOTE — ED Triage Notes (Signed)
Pt presents to the ED with c/o of R upper lateral pain that started last night. Pt also endorses nausea. NAD noted. Pt denies fevers.

## 2023-04-17 DIAGNOSIS — M546 Pain in thoracic spine: Secondary | ICD-10-CM | POA: Diagnosis not present

## 2023-04-22 DIAGNOSIS — M546 Pain in thoracic spine: Secondary | ICD-10-CM | POA: Diagnosis not present

## 2023-04-30 DIAGNOSIS — M546 Pain in thoracic spine: Secondary | ICD-10-CM | POA: Diagnosis not present

## 2023-05-14 DIAGNOSIS — M546 Pain in thoracic spine: Secondary | ICD-10-CM | POA: Diagnosis not present

## 2023-05-27 DIAGNOSIS — M546 Pain in thoracic spine: Secondary | ICD-10-CM | POA: Diagnosis not present

## 2023-06-17 DIAGNOSIS — F988 Other specified behavioral and emotional disorders with onset usually occurring in childhood and adolescence: Secondary | ICD-10-CM | POA: Diagnosis not present

## 2023-06-17 DIAGNOSIS — E7800 Pure hypercholesterolemia, unspecified: Secondary | ICD-10-CM | POA: Diagnosis not present

## 2023-06-17 DIAGNOSIS — N390 Urinary tract infection, site not specified: Secondary | ICD-10-CM | POA: Diagnosis not present

## 2023-06-17 DIAGNOSIS — Z8632 Personal history of gestational diabetes: Secondary | ICD-10-CM | POA: Diagnosis not present

## 2023-06-17 DIAGNOSIS — Z79899 Other long term (current) drug therapy: Secondary | ICD-10-CM | POA: Diagnosis not present

## 2023-06-17 DIAGNOSIS — J4520 Mild intermittent asthma, uncomplicated: Secondary | ICD-10-CM | POA: Diagnosis not present

## 2023-07-20 IMAGING — US US ABDOMEN LIMITED
1 series · 14 of 25 positions shown · non-contrast
Comparison: None.

CLINICAL DATA: RUQ abdominal pain

EXAM:
ULTRASOUND ABDOMEN LIMITED RIGHT UPPER QUADRANT

[Series 1: abdomen us · 14 of 47 slices shown]
[im 1/47]
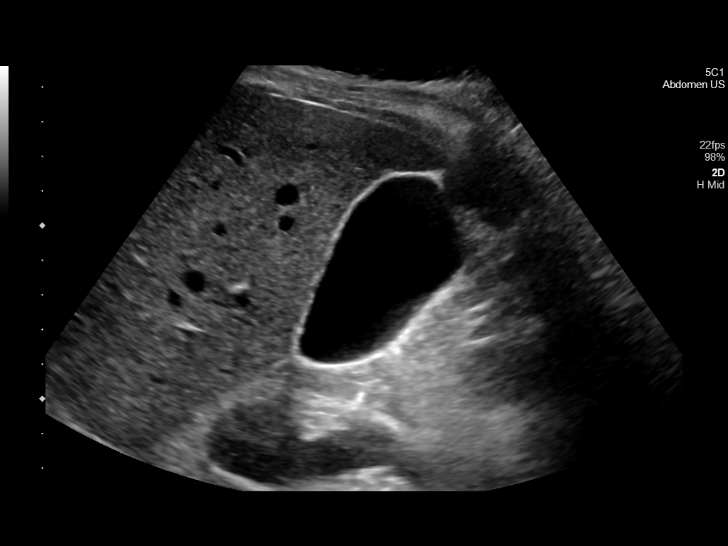
[im 4/47]
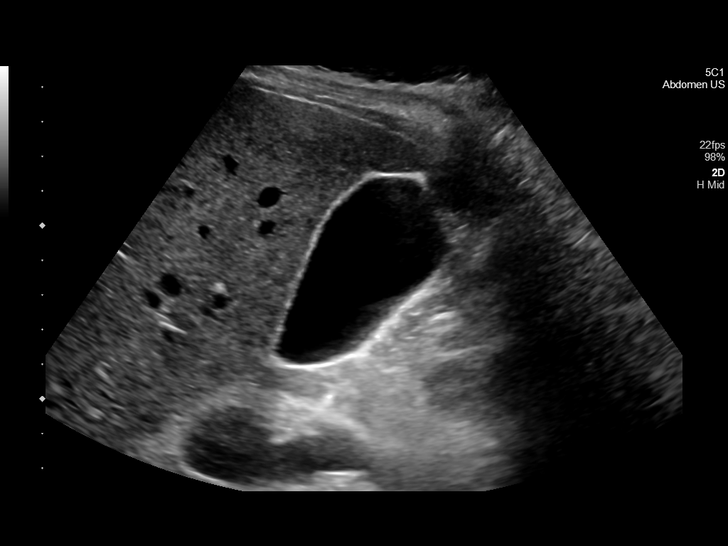
[im 8/47]
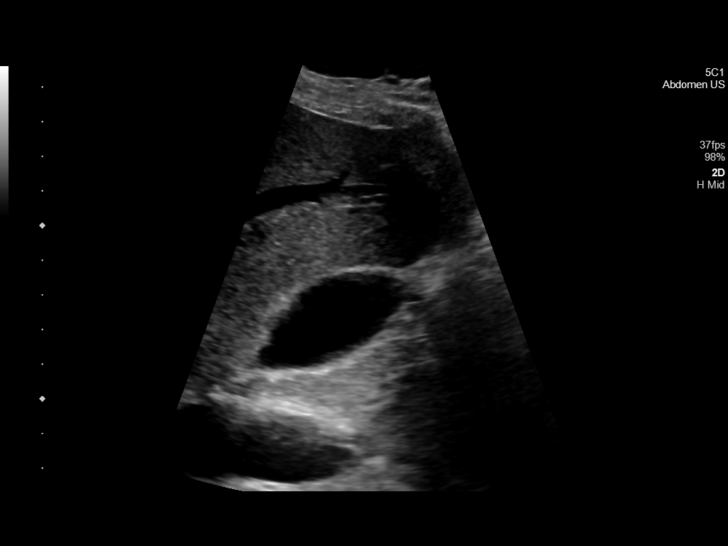
[im 12/47]
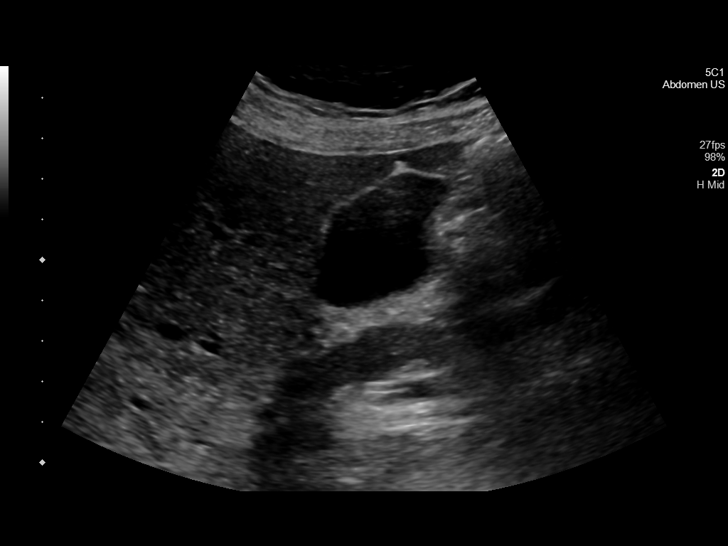
[im 16/47]
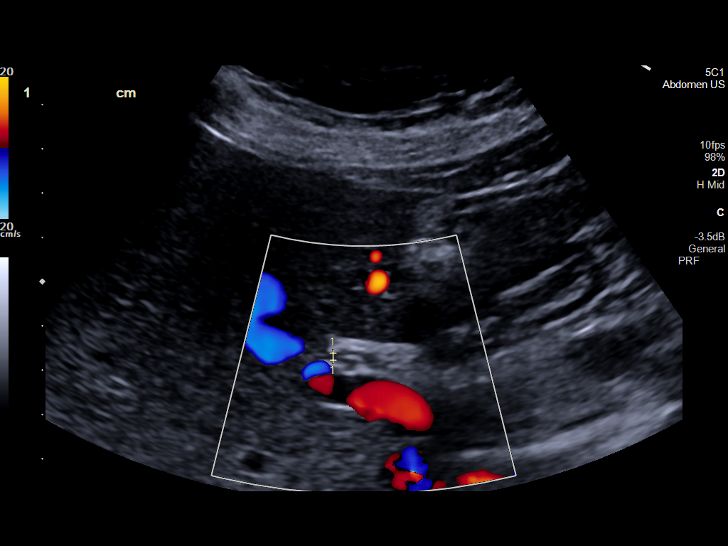
[im 18/47]
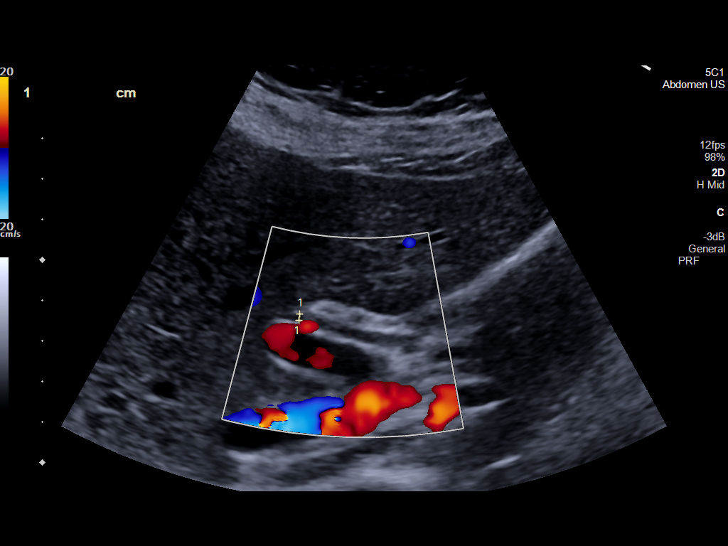
[im 22/47]
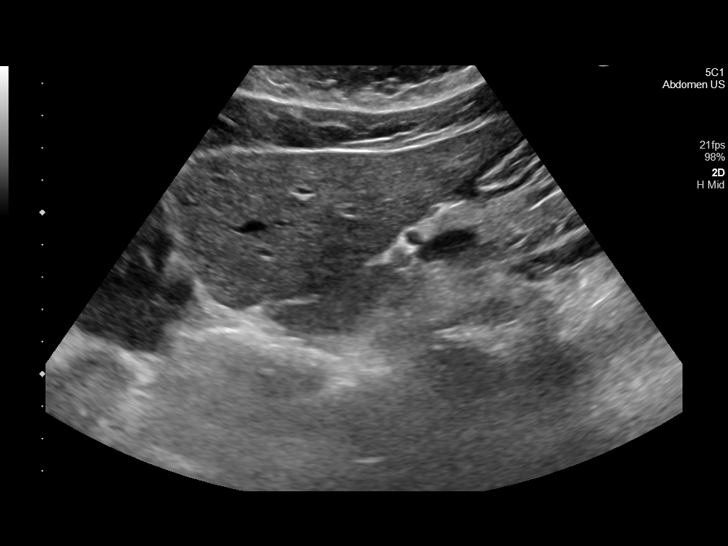
[im 25/47]
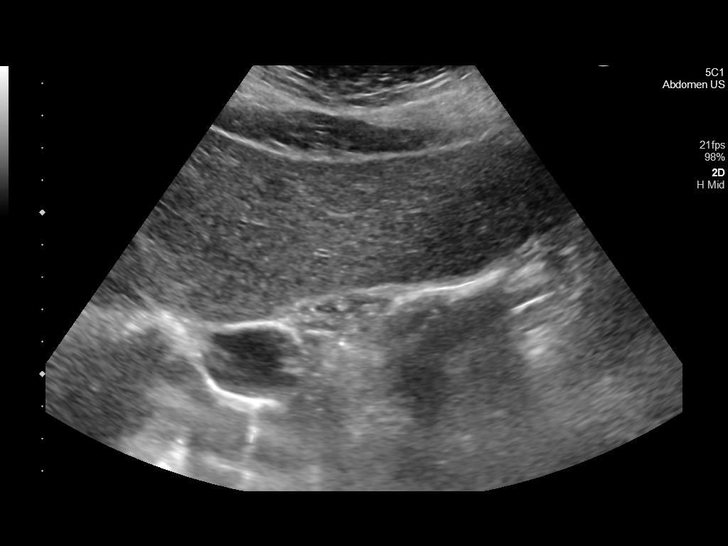
[im 29/47]
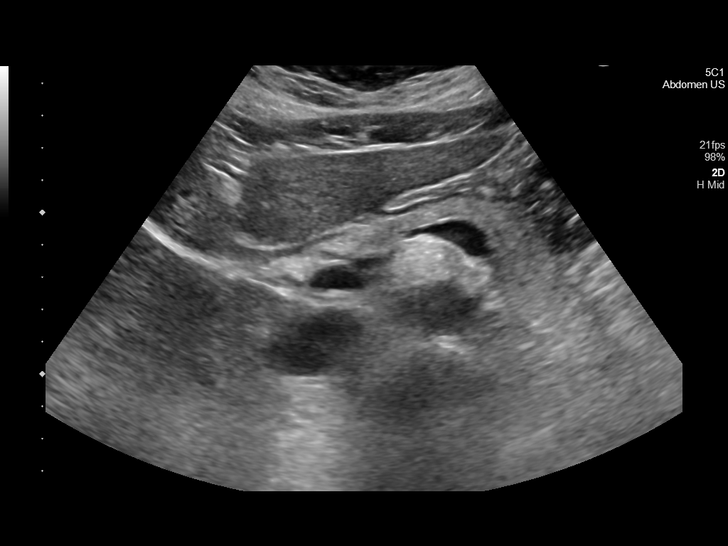
[im 31/47]
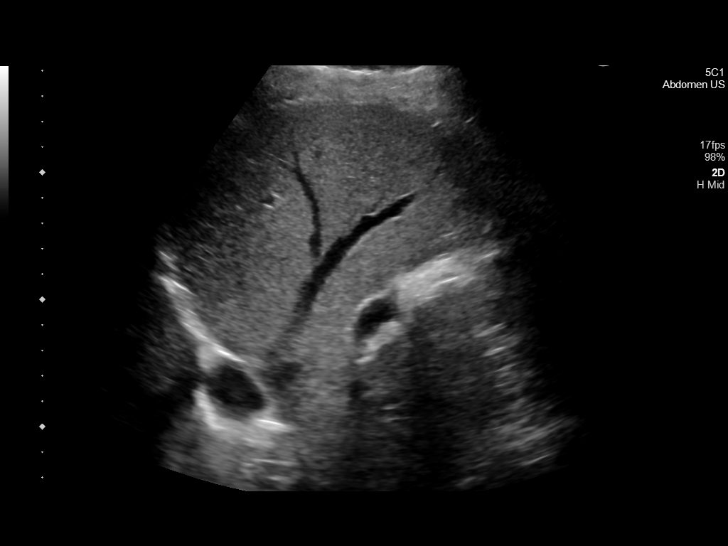
[im 35/47]
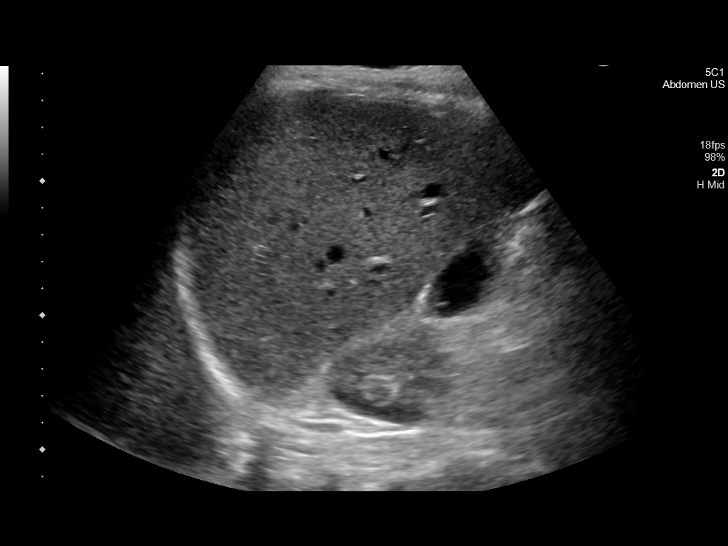
[im 39/47]
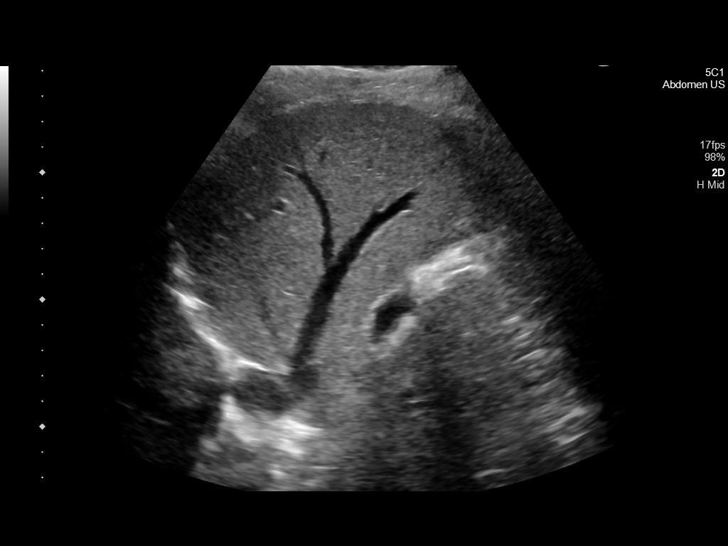
[im 43/47]
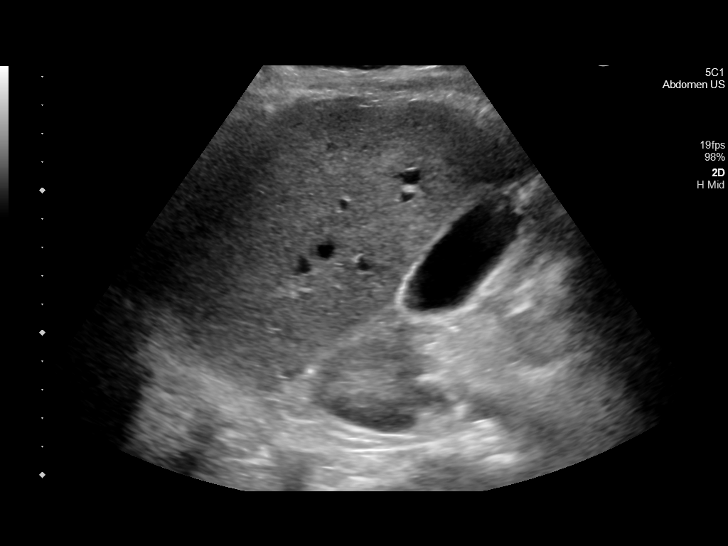
[im 47/47]
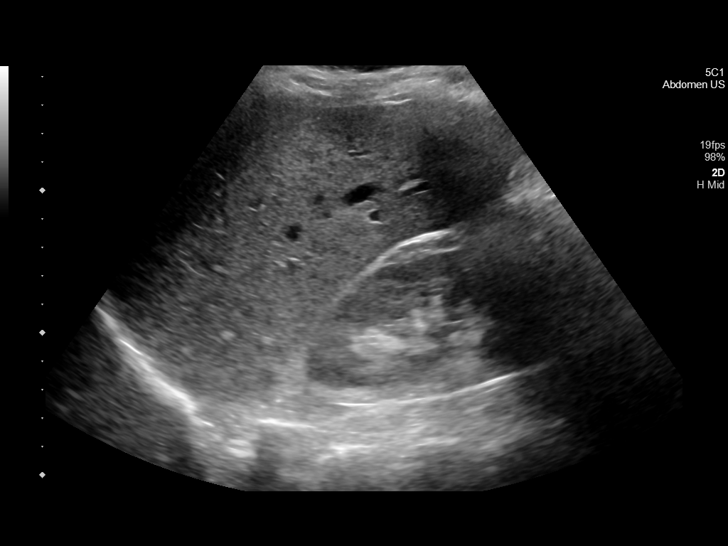

[14 of 25 positions shown; findings below may reference images not displayed]

FINDINGS: Gallbladder:

No gallstones or wall thickening visualized. No sonographic Murphy
sign noted by sonographer.

Common bile duct:

Diameter: 0.2 cm

Liver:

No focal lesion identified. Within normal limits in parenchymal
echogenicity. Portal vein is patent on color Doppler imaging with
normal direction of blood flow towards the liver.

Other: None.
IMPRESSION: Normal sonographic exam of the right upper quadrant.

## 2023-08-06 DIAGNOSIS — H04123 Dry eye syndrome of bilateral lacrimal glands: Secondary | ICD-10-CM | POA: Diagnosis not present

## 2023-12-16 DIAGNOSIS — Z1331 Encounter for screening for depression: Secondary | ICD-10-CM | POA: Diagnosis not present

## 2023-12-16 DIAGNOSIS — Z79899 Other long term (current) drug therapy: Secondary | ICD-10-CM | POA: Diagnosis not present

## 2023-12-16 DIAGNOSIS — F988 Other specified behavioral and emotional disorders with onset usually occurring in childhood and adolescence: Secondary | ICD-10-CM | POA: Diagnosis not present

## 2023-12-16 DIAGNOSIS — R6889 Other general symptoms and signs: Secondary | ICD-10-CM | POA: Diagnosis not present

## 2023-12-16 DIAGNOSIS — N390 Urinary tract infection, site not specified: Secondary | ICD-10-CM | POA: Diagnosis not present

## 2024-05-29 DIAGNOSIS — J029 Acute pharyngitis, unspecified: Secondary | ICD-10-CM | POA: Diagnosis not present

## 2024-05-29 DIAGNOSIS — M791 Myalgia, unspecified site: Secondary | ICD-10-CM | POA: Diagnosis not present

## 2024-05-29 DIAGNOSIS — R051 Acute cough: Secondary | ICD-10-CM | POA: Diagnosis not present

## 2024-05-29 DIAGNOSIS — J069 Acute upper respiratory infection, unspecified: Secondary | ICD-10-CM | POA: Diagnosis not present

## 2024-06-22 DIAGNOSIS — Z79899 Other long term (current) drug therapy: Secondary | ICD-10-CM | POA: Diagnosis not present

## 2024-06-22 DIAGNOSIS — F988 Other specified behavioral and emotional disorders with onset usually occurring in childhood and adolescence: Secondary | ICD-10-CM | POA: Diagnosis not present

## 2024-06-22 DIAGNOSIS — E7800 Pure hypercholesterolemia, unspecified: Secondary | ICD-10-CM | POA: Diagnosis not present

## 2024-06-22 DIAGNOSIS — N390 Urinary tract infection, site not specified: Secondary | ICD-10-CM | POA: Diagnosis not present

## 2024-07-24 DIAGNOSIS — L0291 Cutaneous abscess, unspecified: Secondary | ICD-10-CM | POA: Diagnosis not present

## 2024-07-24 DIAGNOSIS — Z23 Encounter for immunization: Secondary | ICD-10-CM | POA: Diagnosis not present

## 2024-08-12 DIAGNOSIS — F418 Other specified anxiety disorders: Secondary | ICD-10-CM | POA: Diagnosis not present

## 2024-08-12 DIAGNOSIS — N921 Excessive and frequent menstruation with irregular cycle: Secondary | ICD-10-CM | POA: Diagnosis not present
